# Patient Record
Sex: Male | Born: 1995 | Race: White | Hispanic: No | Marital: Single | Smoking: Never smoker
Health system: Southern US, Community
[De-identification: ages and names within clinical notes are randomized; demographics above are authoritative.]

## PROBLEM LIST (undated history)

## (undated) DIAGNOSIS — F909 Attention-deficit hyperactivity disorder, unspecified type: Secondary | ICD-10-CM

## (undated) HISTORY — DX: Attention-deficit hyperactivity disorder, unspecified type: F90.9

---
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
## 2014-04-05 DIAGNOSIS — F900 Attention-deficit hyperactivity disorder, predominantly inattentive type: Secondary | ICD-10-CM | POA: Insufficient documentation

## 2014-10-15 ENCOUNTER — Ambulatory Visit (INDEPENDENT_AMBULATORY_CARE_PROVIDER_SITE_OTHER): Payer: BC Managed Care – PPO | Admitting: Sports Medicine

## 2014-10-15 VITALS — BP 132/63 | Ht 68.0 in | Wt 165.0 lb

## 2014-10-15 DIAGNOSIS — R269 Unspecified abnormalities of gait and mobility: Secondary | ICD-10-CM

## 2014-10-15 DIAGNOSIS — M79673 Pain in unspecified foot: Secondary | ICD-10-CM | POA: Diagnosis not present

## 2014-10-15 NOTE — Progress Notes (Signed)
   Subjective:    Patient ID: Justin Grimes, male    DOB: 1995/03/25, 19 y.o.   MRN: [id number]  HPI chief complaint: New orthotics  Patient comes in today for new custom orthotics. He has had orthotics constructed in the past. He has orthotics for both his regular tennis shoes as well as soccer cleats. Please see the office notes from 09/08/2012 regarding the orthotic that was constructed for his soccer cleat by Dr.Fields. That orthotic was comfortable until he bought a new pair of soccer cleats. Those cleats are a little tighter at the toe box and as a result he has found his custom cleat orthotics to be uncomfortable. He brought in his new cleats for [place] to evaluate. He would also like a new pair of orthotics for his tennis shoes. His old orthotics are several years old and beginning to breakdown. 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 He is here today with his father.  Interim medical history reviewed Medications reviewed Allergies reviewed     Review of Systems As above    Objective:   Physical Exam Well-developed, fit appearing. No acute distress. Awake alert and oriented 3. Vital signs reviewed  Marked pronation bilaterally. Lateral deviation of great toes. Transverse arch collapse. Runs without a limp.       Assessment & Plan:  Pronation  A new pair of custom orthotics were constructed for his tennis shoes. He found them to be very comfortable. For his new soccer cleats we simply added a medium sized scaphoid pad on top of the existing insert. This will help give him some arch support without crowding up the rest of the cleat. He did find this to be comfortable. Follow-up as needed.  Please note that 40 minutes was spent with the patient with greater than 50% of the time spent in face-to-face consultation discussing orthotic construction, instruction, and fitting.   Patient was fitted for a : standard, cushioned, semi-rigid orthotic. The orthotic was heated and afterward the  patient stood on the orthotic blank positioned on the orthotic stand. The patient was positioned in subtalar neutral position and 10 degrees of ankle dorsiflexion in a weight bearing stance. After completion of molding, a stable base was applied to the orthotic blank. The blank was ground to a stable position for weight bearing. Size: 11 Base: Blue EVA Posting: none Additional orthotic padding: none

## 2014-11-29 DIAGNOSIS — F1520 Other stimulant dependence, uncomplicated: Secondary | ICD-10-CM | POA: Insufficient documentation

## 2015-04-11 ENCOUNTER — Ambulatory Visit (INDEPENDENT_AMBULATORY_CARE_PROVIDER_SITE_OTHER): Payer: BC Managed Care – PPO | Admitting: Family Medicine

## 2015-04-11 ENCOUNTER — Encounter: Payer: Self-pay | Admitting: Family Medicine

## 2015-04-11 VITALS — BP 102/62 | HR 60 | Ht 67.0 in | Wt 159.0 lb

## 2015-04-11 DIAGNOSIS — M9904 Segmental and somatic dysfunction of sacral region: Secondary | ICD-10-CM | POA: Diagnosis not present

## 2015-04-11 DIAGNOSIS — M999 Biomechanical lesion, unspecified: Secondary | ICD-10-CM | POA: Insufficient documentation

## 2015-04-11 DIAGNOSIS — M9903 Segmental and somatic dysfunction of lumbar region: Secondary | ICD-10-CM

## 2015-04-11 DIAGNOSIS — M533 Sacrococcygeal disorders, not elsewhere classified: Secondary | ICD-10-CM

## 2015-04-11 DIAGNOSIS — M9902 Segmental and somatic dysfunction of thoracic region: Secondary | ICD-10-CM | POA: Diagnosis not present

## 2015-04-11 DIAGNOSIS — M24559 Contracture, unspecified hip: Secondary | ICD-10-CM | POA: Insufficient documentation

## 2015-04-11 DIAGNOSIS — M24551 Contracture, right hip: Secondary | ICD-10-CM

## 2015-04-11 NOTE — Assessment & Plan Note (Signed)
Decision today to treat with OMT was based on Physical Exam  After verbal consent patient was treated with HVLA, ME techniques in thoracic, lumbar and sacral areas  Patient tolerated the procedure well with improvement in symptoms  Patient given exercises, stretches and lifestyle modifications  See medications in patient instructions if given  Patient will follow up in 3 weeks      

## 2015-04-11 NOTE — Progress Notes (Signed)
Justin Grimes [address] [place], [place] [address] Phone: [phone] Subjective:     CC: Low back pain  BJY:NWGNFAOZHY Justin Grimes is a 20 y.o. male coming in with complaint of low back pain. Patient describes it as more of a dull, throbbing aching sensation that seems to be after activity. Does not stop him from any activity. Patient does play soccer regularly and does lift weights. Has been trying to do more exercises and stretching which has seemed to help somewhat. No radiation down the leg or any numbness or weakness. Denies any bowel or bladder incontinence. Did not have any significant back pain until the last multiple months. Rates the severity of 4 out of 10 overall.     Past Medical History  Diagnosis Date  . ADHD (attention deficit hyperactivity disorder)    No past surgical history on file. Social History   Social History  . Marital Status: Single    Spouse Name: N/A  . Number of Children: N/A  . Years of Education: N/A   Social History Main Topics  . Smoking status: Never Smoker   . Smokeless tobacco: None  . Alcohol Use: None  . Drug Use: None  . Sexual Activity: Not Asked   Other Topics Concern  . None   Social History Narrative   No Known Allergies Family History  Problem Relation Age of Onset  . Hypertension Maternal Grandmother   . Hyperlipidemia Maternal Grandmother   . Drug abuse Maternal Grandfather   . Asthma Paternal Grandmother   . Heart disease Paternal Grandmother   . Kidney disease Neg Hx   . Learning disabilities Neg Hx   . Mental illness Neg Hx   . Mental retardation Neg Hx   . Miscarriages / Stillbirths Neg Hx   . Stroke Neg Hx   . Vision loss Neg Hx   . Hearing loss Neg Hx   . Diabetes Neg Hx   . Depression Neg Hx   . COPD Neg Hx   . Cancer Neg Hx   . Birth defects Neg Hx   . Alcohol abuse Neg Hx   . Arthritis Neg Hx     Past medical history, social, surgical and family history all  reviewed in electronic medical record.  No pertanent information unless stated regarding to the chief complaint.   Review of Systems: No headache, visual changes, nausea, vomiting, diarrhea, constipation, dizziness, abdominal pain, skin rash, fevers, chills, night sweats, weight loss, swollen lymph nodes, body aches, joint swelling, muscle aches, chest pain, shortness of breath, mood changes.   Objective Blood pressure 102/62, pulse 60, height  (1.702 m), weight 159 lb (72.122 kg), SpO2 99 %.  General: No apparent distress alert and oriented x3 mood and affect normal, dressed appropriately.  HEENT: Pupils equal, extraocular movements intact  Respiratory: Patient's speak in full sentences and does not appear short of breath  Cardiovascular: No lower extremity edema, non tender, no erythema  Skin: Warm dry intact with no signs of infection or rash on extremities or on axial skeleton.  Abdomen: Soft nontender  Neuro: Cranial nerves II through XII are intact, neurovascularly intact in all extremities with 2+ DTRs and 2+ pulses.  Lymph: No lymphadenopathy of posterior or anterior cervical chain or axillae bilaterally.  Gait normal with good balance and coordination.  MSK:  Non tender with full range of motion and good stability and symmetric strength and tone of shoulders, elbows, wrist, hip, knee and ankles  bilaterally.  Back Exam:  Inspection: Unremarkable  Motion: Flexion 45 deg, Extension 15 deg, Side Bending to 45 deg bilaterally, significantly tight hip flexors bilaterally record of left Rotation to 45 deg bilaterally  SLR laying: Negative  XSLR laying: Negative  Palpable tenderness: Mild discomfort of the right sacroiliac joint FABER: negative. Sensory change: Gross sensation intact to all lumbar and sacral dermatomes.  Reflexes: 2+ at both patellar tendons, 2+ at achilles tendons, Babinski's downgoing.  Strength at foot  Plantar-flexion: 5/5 Dorsi-flexion: 5/5 Eversion: 5/5  Inversion: 5/5  Leg strength  Quad: 5/5 Hamstring: 5/5 Hip flexor: 5/5 Hip abductors: 5/5  Gait unremarkable.  Osteopathic findings T3 extended rotated and side bent right L2 flexed rotated and side bent left Sacrum right on right  Procedure note 97110; 15 minutes spent for Therapeutic exercises as stated in above notes.  This included exercises focusing on stretching, strengthening, with significant focus on eccentric aspects.  Sacroiliac Joint Mobilization and Rehab 1. Work on pretzel stretching, shoulder back and leg draped in front. 3-5 sets, 30 sec.. 2. hip abductor rotations. standing, hip flexion and rotation outward then inward. 3 sets, 15 reps. when can do comfortably, add ankle weights starting at 2 pounds.  3. cross over stretching - shoulder back to ground, same side leg crossover. 3-5 sets for 30 min..  4. rolling up and back knees to chest and rocking. 5. sacral tilt - 5 sets, hold for 5-10 seconds  Proper technique shown and discussed handout in great detail with ATC.  All questions were discussed and answered.     Impression and Recommendations:     This case required medical decision making of moderate complexity.      Note: This dictation was prepared with Dragon dictation along with smaller phrase technology. Any transcriptional errors that result from this process are unintentional.        ;

## 2015-04-11 NOTE — Patient Instructions (Signed)
Good to see you.  Ice 20 minutes 2 times daily. Usually after activity and before bed. Exercises daily Vitamin D 2000 IU daily Choline  daily  OK to stay active! See me again in 3 weeks.

## 2015-04-11 NOTE — Progress Notes (Signed)
Pre visit review using our clinic review tool, if applicable. No additional management support is needed unless otherwise documented below in the visit note. 

## 2015-04-11 NOTE — Assessment & Plan Note (Signed)
Patient does have more of a sacroiliac joint dysfunction. Type hip flexors. Seems to be mostly on the right side. Responded well to osteopathic manipulation. Given core stabilization techniques and hip abductors. Discussed range of motion exercises. Patient was feeling significantly better after the manipulation. Discussed over-the-counter medications. She will work with Event organiser today. Patient will come back and see me again in 3 weeks for further evaluation and treatment.

## 2015-05-02 ENCOUNTER — Ambulatory Visit: Payer: BC Managed Care – PPO | Admitting: Family Medicine

## 2015-05-09 ENCOUNTER — Ambulatory Visit (INDEPENDENT_AMBULATORY_CARE_PROVIDER_SITE_OTHER): Payer: BC Managed Care – PPO | Admitting: Family Medicine

## 2015-05-09 ENCOUNTER — Encounter: Payer: Self-pay | Admitting: Family Medicine

## 2015-05-09 VITALS — BP 128/74 | HR 85 | Ht 67.0 in | Wt 155.0 lb

## 2015-05-09 DIAGNOSIS — M9904 Segmental and somatic dysfunction of sacral region: Secondary | ICD-10-CM | POA: Diagnosis not present

## 2015-05-09 DIAGNOSIS — M999 Biomechanical lesion, unspecified: Secondary | ICD-10-CM

## 2015-05-09 DIAGNOSIS — M9902 Segmental and somatic dysfunction of thoracic region: Secondary | ICD-10-CM | POA: Diagnosis not present

## 2015-05-09 DIAGNOSIS — M9903 Segmental and somatic dysfunction of lumbar region: Secondary | ICD-10-CM

## 2015-05-09 DIAGNOSIS — M533 Sacrococcygeal disorders, not elsewhere classified: Secondary | ICD-10-CM

## 2015-05-09 NOTE — Assessment & Plan Note (Signed)
Continues to have some mild difficulty with this. I do not think he'll be a long-term problem. Encourage him to continue work on core strength as well as hip abductor strengthening. We discussed again about diet and over-the-counter medications. We discussed continuing icing as necessary. Patient will come back in 4-6 weeks.

## 2015-05-09 NOTE — Progress Notes (Signed)
Pre visit review using our clinic review tool, if applicable. No additional management support is needed unless otherwise documented below in the visit note. 

## 2015-05-09 NOTE — Progress Notes (Signed)
Tawana ScaleZach Smith D.O. [facility] [address] [place], [place] [address] Phone: [phone](336) [phone] Subjective:     CC: Low back pain f/u  ONG:EXBMWUXLKGHPI:Subjective Justin LivingRobert Bobby Grimes is a 20 y.o. male coming in with complaint of low back pain. Patient had has significant tight hip flexors. Patient did have some muscle imbalances. Has been doing exercises. States that he did respond very well to the home exercises and was not having any pain for 2 weeks and then it started to slowly progressing again. Patient has been doing some the icing and doing the vitamins. Has noticed some mild improvement overall.     Past Medical History  Diagnosis Date  . ADHD (attention deficit hyperactivity disorder)    No past surgical history on file. Social History   Social History  . Marital Status: Single    Spouse Name: N/A  . Number of Children: N/A  . Years of Education: N/A   Social History Main Topics  . Smoking status: Never Smoker   . Smokeless tobacco: Not on file  . Alcohol Use: Not on file  . Drug Use: Not on file  . Sexual Activity: Not on file   Other Topics Concern  . Not on file   Social History Narrative   No Known Allergies Family History  Problem Relation Age of Onset  . Hypertension Maternal Grandmother   . Hyperlipidemia Maternal Grandmother   . Drug abuse Maternal Grandfather   . Asthma Paternal Grandmother   . Heart disease Paternal Grandmother   . Kidney disease Neg Hx   . Learning disabilities Neg Hx   . Mental illness Neg Hx   . Mental retardation Neg Hx   . Miscarriages / Stillbirths Neg Hx   . Stroke Neg Hx   . Vision loss Neg Hx   . Hearing loss Neg Hx   . Diabetes Neg Hx   . Depression Neg Hx   . COPD Neg Hx   . Cancer Neg Hx   . Birth defects Neg Hx   . Alcohol abuse Neg Hx   . Arthritis Neg Hx     Past medical history, social, surgical and family history all reviewed in electronic medical record.  No pertanent information unless stated regarding to the  chief complaint.   Review of Systems: No headache, visual changes, nausea, vomiting, diarrhea, constipation, dizziness, abdominal pain, skin rash, fevers, chills, night sweats, weight loss, swollen lymph nodes, body aches, joint swelling, muscle aches, chest pain, shortness of breath, mood changes.   Objective Blood pressure 128/74, pulse 85, weight 155 lb (70.308 kg), SpO2 98 %.  General: No apparent distress alert and oriented x3 mood and affect normal, dressed appropriately.  HEENT: Pupils equal, extraocular movements intact  Respiratory: Patient's speak in full sentences and does not appear short of breath  Cardiovascular: No lower extremity edema, non tender, no erythema  Skin: Warm dry intact with no signs of infection or rash on extremities or on axial skeleton.  Abdomen: Soft nontender  Neuro: Cranial nerves II through XII are intact, neurovascularly intact in all extremities with 2+ DTRs and 2+ pulses.  Lymph: No lymphadenopathy of posterior or anterior cervical chain or axillae bilaterally.  Gait normal with good balance and coordination.  MSK:  Non tender with full range of motion and good stability and symmetric strength and tone of shoulders, elbows, wrist, hip, knee and ankles bilaterally.  Back Exam:  Inspection: Unremarkable  Motion: Flexion 45 deg, Extension 15 deg, Side Bending to  45 deg bilaterally, continued tight hip flexors as well as tensor fascia lata tear on the right side Rotation to 45 deg bilaterally  SLR laying: Negative  XSLR laying: Negative  Palpable tenderness: Some mild pain over the sacroiliac joint bilaterally FABER: negative. Sensory change: Gross sensation intact to all lumbar and sacral dermatomes.  Reflexes: 2+ at both patellar tendons, 2+ at achilles tendons, Babinski's downgoing.  Strength at foot  Plantar-flexion: 5/5 Dorsi-flexion: 5/5 Eversion: 5/5 Inversion: 5/5  Leg strength  Quad: 5/5 Hamstring: 5/5 Hip flexor: 5/5 Hip abductors: 5/5  Gait  unremarkable.  Osteopathic findings T3 extended rotated and side bent right L2 flexed rotated and side bent left Sacrum right on right     Impression and Recommendations:     This case required medical decision making of moderate complexity.      Note: This dictation was prepared with Dragon dictation along with smaller phrase technology. Any transcriptional errors that result from this process are unintentional.        ;

## 2015-05-09 NOTE — Patient Instructions (Signed)
You are making progress Stretches only 3-5 times a week.  Ice is your friend Continue the vitamins See me again in 4-6 weeks.

## 2015-05-09 NOTE — Assessment & Plan Note (Signed)
Decision today to treat with OMT was based on Physical Exam  After verbal consent patient was treated with HVLA, ME techniques in thoracic, lumbar and sacral areas  Patient tolerated the procedure well with improvement in symptoms  Patient given exercises, stretches and lifestyle modifications  See medications in patient instructions if given  Patient will follow up in 4-6 weeks          

## 2015-06-13 ENCOUNTER — Ambulatory Visit (INDEPENDENT_AMBULATORY_CARE_PROVIDER_SITE_OTHER): Payer: BC Managed Care – PPO | Admitting: Family Medicine

## 2015-06-13 ENCOUNTER — Encounter: Payer: Self-pay | Admitting: Family Medicine

## 2015-06-13 VITALS — BP 120/76 | HR 66 | Wt 155.0 lb

## 2015-06-13 DIAGNOSIS — M9903 Segmental and somatic dysfunction of lumbar region: Secondary | ICD-10-CM

## 2015-06-13 DIAGNOSIS — M24551 Contracture, right hip: Secondary | ICD-10-CM | POA: Diagnosis not present

## 2015-06-13 DIAGNOSIS — M533 Sacrococcygeal disorders, not elsewhere classified: Secondary | ICD-10-CM

## 2015-06-13 DIAGNOSIS — M9902 Segmental and somatic dysfunction of thoracic region: Secondary | ICD-10-CM

## 2015-06-13 DIAGNOSIS — M9904 Segmental and somatic dysfunction of sacral region: Secondary | ICD-10-CM | POA: Diagnosis not present

## 2015-06-13 DIAGNOSIS — M999 Biomechanical lesion, unspecified: Secondary | ICD-10-CM

## 2015-06-13 NOTE — Progress Notes (Signed)
[name] [address] [place], [place] [address] Phone: [phone] Subjective:     CC: Low back pain f/u  BJY:NWGNFAOZHY Justin Grimes is a 20 y.o. male coming in with complaint of low back pain. Patient had has significant tight hip flexors. Patient did have some muscle imbalances. Has been doing exercises. Patient did have a soccer tournament this weekend when she was able to play never gave her no trouble. Patient also played tennis this weekend with no problems except some mild tightness of the hip flexor. States has not needed any anti-inflammatories. Overall feeling good. Even lift weights today.     Past Medical History  Diagnosis Date  . ADHD (attention deficit hyperactivity disorder)    No past surgical history on file. Social History   Social History  . Marital Status: Single    Spouse Name: N/A  . Number of Children: N/A  . Years of Education: N/A   Social History Main Topics  . Smoking status: Never Smoker   . Smokeless tobacco: Not on file  . Alcohol Use: Not on file  . Drug Use: Not on file  . Sexual Activity: Not on file   Other Topics Concern  . Not on file   Social History Narrative   No Known Allergies Family History  Problem Relation Age of Onset  . Hypertension Maternal Grandmother   . Hyperlipidemia Maternal Grandmother   . Drug abuse Maternal Grandfather   . Asthma Paternal Grandmother   . Heart disease Paternal Grandmother   . Kidney disease Neg Hx   . Learning disabilities Neg Hx   . Mental illness Neg Hx   . Mental retardation Neg Hx   . Miscarriages / Stillbirths Neg Hx   . Stroke Neg Hx   . Vision loss Neg Hx   . Hearing loss Neg Hx   . Diabetes Neg Hx   . Depression Neg Hx   . COPD Neg Hx   . Cancer Neg Hx   . Birth defects Neg Hx   . Alcohol abuse Neg Hx   . Arthritis Neg Hx     Past medical history, social, surgical and family history all reviewed in electronic medical record.  No  pertanent information unless stated regarding to the chief complaint.   Review of Systems: No headache, visual changes, nausea, vomiting, diarrhea, constipation, dizziness, abdominal pain, skin rash, fevers, chills, night sweats, weight loss, swollen lymph nodes, body aches, joint swelling, muscle aches, chest pain, shortness of breath, mood changes.   Objective There were no vitals taken for this visit.  General: No apparent distress alert and oriented x3 mood and affect normal, dressed appropriately.  HEENT: Pupils equal, extraocular movements intact  Respiratory: Patient's speak in full sentences and does not appear short of breath  Cardiovascular: No lower extremity edema, non tender, no erythema  Skin: Warm dry intact with no signs of infection or rash on extremities or on axial skeleton.  Abdomen: Soft nontender  Neuro: Cranial nerves II through XII are intact, neurovascularly intact in all extremities with 2+ DTRs and 2+ pulses.  Lymph: No lymphadenopathy of posterior or anterior cervical chain or axillae bilaterally.  Gait normal with good balance and coordination.  MSK:  Non tender with full range of motion and good stability and symmetric strength and tone of shoulders, elbows, wrist, hip, knee and ankles bilaterally.  Back Exam:  Inspection: Unremarkable  Motion: Flexion 45 deg, Extension 15 deg, Side Bending to 45  deg bilaterally,  Continued mild tightness of the hip flexors bilaterally Rotation to 45 deg bilaterally  SLR laying: Negative  XSLR laying: Negative  Palpable tenderness:  Very minimal discomfort over the sacroiliac joint right greater than left FABER: negative. Sensory change: Gross sensation intact to all lumbar and sacral dermatomes.  Reflexes: 2+ at both patellar tendons, 2+ at achilles tendons, Babinski's downgoing.  Strength at foot  Plantar-flexion: 5/5 Dorsi-flexion: 5/5 Eversion: 5/5 Inversion: 5/5  Leg strength  Quad: 5/5 Hamstring: 5/5 Hip flexor: 5/5  Hip abductors: 5/5  Gait unremarkable.  Osteopathic findings T3 extended rotated and side bent right L2 flexed rotated and side bent left L4 flexed rotated and side bent right Sacrum right on right     Impression and Recommendations:     This case required medical decision making of moderate complexity.      Note: This dictation was prepared with Dragon dictation along with smaller phrase technology. Any transcriptional errors that result from this process are unintentional.        ;

## 2015-06-13 NOTE — Assessment & Plan Note (Signed)
Continues to have tightness. Patient also has some discomfort over the quadratus lumborum. We discussed with patient to continue the same exercises and activity. We discussed icing regimen. Patient will come back and see me again in 6-8 weeks for further evaluation and treatment.

## 2015-06-13 NOTE — Assessment & Plan Note (Signed)
Decision today to treat with OMT was based on Physical Exam  After verbal consent patient was treated with HVLA, ME techniques in thoracic, lumbar and sacral areas  Patient tolerated the procedure well with improvement in symptoms  Patient given exercises, stretches and lifestyle modifications  See medications in patient instructions if given  Patient will follow up in 6-8 weeks    

## 2015-06-13 NOTE — Assessment & Plan Note (Signed)
Continues to have some mild rotation. We discussed the importance of the exercises regularly as well as the hip abductor strengthening. Follow-up in 6-8 weeks.

## 2015-06-13 NOTE — Patient Instructions (Signed)
Doing great  6-8 weeks see you then

## 2015-07-25 ENCOUNTER — Ambulatory Visit: Payer: BC Managed Care – PPO | Admitting: Family Medicine

## 2015-08-16 ENCOUNTER — Ambulatory Visit: Payer: BC Managed Care – PPO | Admitting: Family Medicine

## 2015-08-19 ENCOUNTER — Ambulatory Visit (INDEPENDENT_AMBULATORY_CARE_PROVIDER_SITE_OTHER): Payer: BC Managed Care – PPO | Admitting: Family Medicine

## 2015-08-19 ENCOUNTER — Encounter: Payer: Self-pay | Admitting: Family Medicine

## 2015-08-19 VITALS — BP 122/76 | HR 62 | Wt 151.0 lb

## 2015-08-19 DIAGNOSIS — M24551 Contracture, right hip: Secondary | ICD-10-CM

## 2015-08-19 DIAGNOSIS — M9903 Segmental and somatic dysfunction of lumbar region: Secondary | ICD-10-CM

## 2015-08-19 DIAGNOSIS — M9904 Segmental and somatic dysfunction of sacral region: Secondary | ICD-10-CM | POA: Diagnosis not present

## 2015-08-19 DIAGNOSIS — M9902 Segmental and somatic dysfunction of thoracic region: Secondary | ICD-10-CM

## 2015-08-19 DIAGNOSIS — M999 Biomechanical lesion, unspecified: Secondary | ICD-10-CM

## 2015-08-19 NOTE — Assessment & Plan Note (Signed)
Discussed with patient again that this is keep. He will continue to need to monitor this closely especially with him increasing her running. We discussed stretches again and showed proper technique. Patient continues respond well to osteopathic manipulation. We'll continue with the vitamin supplementation. We discussed icing regimen. Follow-up with me again in 8-10 weeks

## 2015-08-19 NOTE — Patient Instructions (Signed)
Good to see you  Ice is your friend when needed Stretch the hip flexors after running always Tennis ball between shoulder blades when sitting  Along amount of time On wall with heels, butt shoulder and head touching for a goal of 5 minutes daily  Keep wearing good shoes at work  See me again in 8ish weeks!

## 2015-08-19 NOTE — Assessment & Plan Note (Signed)
Decision today to treat with OMT was based on Physical Exam  After verbal consent patient was treated with HVLA, ME techniques in thoracic, lumbar and sacral areas  Patient tolerated the procedure well with improvement in symptoms  Patient given exercises, stretches and lifestyle modifications  See medications in patient instructions if given  Patient will follow up in 8-10 weeks      

## 2015-08-19 NOTE — Progress Notes (Signed)
Tawana ScaleZach Bryanne Riquelme D.O. [facility] [address] [place], [place] [address] Phone: [phone](336) [phone] Subjective:     CC: Low back pain f/u  BJY:NWGNFAOZHYHPI:Subjective Justin LivingRobert Bobby Grimes is a 20 y.o. male coming in with complaint of low back pain. Patient had has significant tight hip flexors. Patient did have some muscle imbalances. Has been doing exercises.  Patient has been running more regularly. Patient states that overall he seems to be doing well with some mild increase in stiffness of the lower back. No radiation the pain. States that the upper back seems to be doing better with him not studying as frequently. Patient feels that he is making some progress. Denies any radiation down the arm. States that if he does the stretching he seems to do relatively better. This not doing them on a regular basis.    Past Medical History  Diagnosis Date  . ADHD (attention deficit hyperactivity disorder)    No past surgical history on file. Social History   Social History  . Marital Status: Single    Spouse Name: N/A  . Number of Children: N/A  . Years of Education: N/A   Social History Main Topics  . Smoking status: Never Smoker   . Smokeless tobacco: Not on file  . Alcohol Use: Not on file  . Drug Use: Not on file  . Sexual Activity: Not on file   Other Topics Concern  . Not on file   Social History Narrative   No Known Allergies Family History  Problem Relation Age of Onset  . Hypertension Maternal Grandmother   . Hyperlipidemia Maternal Grandmother   . Drug abuse Maternal Grandfather   . Asthma Paternal Grandmother   . Heart disease Paternal Grandmother   . Kidney disease Neg Hx   . Learning disabilities Neg Hx   . Mental illness Neg Hx   . Mental retardation Neg Hx   . Miscarriages / Stillbirths Neg Hx   . Stroke Neg Hx   . Vision loss Neg Hx   . Hearing loss Neg Hx   . Diabetes Neg Hx   . Depression Neg Hx   . COPD Neg Hx   . Cancer Neg Hx   . Birth defects Neg Hx   .  Alcohol abuse Neg Hx   . Arthritis Neg Hx     Past medical history, social, surgical and family history all reviewed in electronic medical record.  No pertanent information unless stated regarding to the chief complaint.   Review of Systems: No headache, visual changes, nausea, vomiting, diarrhea, constipation, dizziness, abdominal pain, skin rash, fevers, chills, night sweats, weight loss, swollen lymph nodes, body aches, joint swelling, muscle aches, chest pain, shortness of breath, mood changes.   Objective There were no vitals taken for this visit.  General: No apparent distress alert and oriented x3 mood and affect normal, dressed appropriately.  HEENT: Pupils equal, extraocular movements intact  Respiratory: Patient's speak in full sentences and does not appear short of breath  Cardiovascular: No lower extremity edema, non tender, no erythema  Skin: Warm dry intact with no signs of infection or rash on extremities or on axial skeleton.  Abdomen: Soft nontender  Neuro: Cranial nerves II through XII are intact, neurovascularly intact in all extremities with 2+ DTRs and 2+ pulses.  Lymph: No lymphadenopathy of posterior or anterior cervical chain or axillae bilaterally.  Gait normal with good balance and coordination.  MSK:  Non tender with full range of motion and good stability  and symmetric strength and tone of shoulders, elbows, wrist, hip, knee and ankles bilaterally.  Back Exam:  Inspection: Unremarkable  Motion: Flexion 45 deg, Extension 25 deg, Side Bending to 45 deg bilaterally,  Continued mild tightness of the hip flexors bilaterally worse on the right side today Rotation to 45 deg bilaterally  SLR laying: Negative  XSLR laying: Negative  Palpable tenderness:  Increased tenderness over the right sacroiliac joint FABER: Moderately positive right. Sensory change: Gross sensation intact to all lumbar and sacral dermatomes.  Reflexes: 2+ at both patellar tendons, 2+ at achilles  tendons, Babinski's downgoing.  Strength at foot  Plantar-flexion: 5/5 Dorsi-flexion: 5/5 Eversion: 5/5 Inversion: 5/5  Leg strength  Quad: 5/5 Hamstring: 5/5 Hip flexor: 5/5 Hip abductors: 5/5  Gait unremarkable.  Osteopathic findings T2 extended rotated and side bent right L2 flexed rotated and side bent left L5 flexed rotated and side bent right Sacrum right on right (backward torsion)     Impression and Recommendations:     This case required medical decision making of moderate complexity.      Note: This dictation was prepared with Dragon dictation along with smaller phrase technology. Any transcriptional errors that result from this process are unintentional.        ;

## 2015-10-13 NOTE — Assessment & Plan Note (Deleted)
Overall doing well Discussed icing, HEP and core stabilization Discussed what to avoid as well.  No numbness.  RTC in 3-4 months

## 2015-10-13 NOTE — Progress Notes (Deleted)
Justin ScaleZach Grimes D.O. [facility] [address] [place], [place] [address] Phone: [phone] Subjective:     CC: Low back pain f/u  MVH:[id number]:Subjective  Justin Grimes is a 20 y.o. male coming in with complaint of low back pain. Patient had has significant tight hip flexors. Patient did have some muscle imbalances. Has been doing exercises.  Patient has been running more regularly. Patient states that overall he seems to be doing well with some mild increase in stiffness of the lower back. No radiation the pain. States that the upper back seems to be doing better with him not studying as frequently. Patient feels that he is making some progress. Denies any radiation down the arm. States that if he does the stretching he seems to do relatively better. This not doing them on a regular basis.    Past Medical History:  Diagnosis Date  . ADHD (attention deficit hyperactivity disorder)    No past surgical history on file. Social History   Social History  . Marital status: Single    Spouse name: N/A  . Number of children: N/A  . Years of education: N/A   Social History Main Topics  . Smoking status: Never Smoker  . Smokeless tobacco: Not on file  . Alcohol use Not on file  . Drug use: Unknown  . Sexual activity: Not on file   Other Topics Concern  . Not on file   Social History Narrative  . No narrative on file   No Known Allergies Family History  Problem Relation Age of Onset  . Hypertension Maternal Grandmother   . Hyperlipidemia Maternal Grandmother   . Drug abuse Maternal Grandfather   . Asthma Paternal Grandmother   . Heart disease Paternal Grandmother   . Kidney disease Neg Hx   . Learning disabilities Neg Hx   . Mental illness Neg Hx   . Mental retardation Neg Hx   . Miscarriages / Stillbirths Neg Hx   . Stroke Neg Hx   . Vision loss Neg Hx   . Hearing loss Neg Hx   . Diabetes Neg Hx   . Depression Neg Hx   . COPD Neg Hx   . Cancer Neg Hx   . Birth  defects Neg Hx   . Alcohol abuse Neg Hx   . Arthritis Neg Hx     Past medical history, social, surgical and family history all reviewed in electronic medical record.  No pertanent information unless stated regarding to the chief complaint.   Review of Systems: No headache, visual changes, nausea, vomiting, diarrhea, constipation, dizziness, abdominal pain, skin rash, fevers, chills, night sweats, weight loss, swollen lymph nodes, body aches, joint swelling, muscle aches, chest pain, shortness of breath, mood changes.   Objective  There were no vitals taken for this visit.  General: No apparent distress alert and oriented x3 mood and affect normal, dressed appropriately.  HEENT: Pupils equal, extraocular movements intact  Respiratory: Patient's speak in full sentences and does not appear short of breath  Cardiovascular: No lower extremity edema, non tender, no erythema  Skin: Warm dry intact with no signs of infection or rash on extremities or on axial skeleton.  Abdomen: Soft nontender  Neuro: Cranial nerves II through XII are intact, neurovascularly intact in all extremities with 2+ DTRs and 2+ pulses.  Lymph: No lymphadenopathy of posterior or anterior cervical chain or axillae bilaterally.  Gait normal with good balance and coordination.  MSK:  Non tender with full range  of motion and good stability and symmetric strength and tone of shoulders, elbows, wrist, hip, knee and ankles bilaterally.  Back Exam:  Inspection: Unremarkable  Motion: Flexion 45 deg, Extension 25 deg, Side Bending to 45 deg bilaterally,  Continued mild tightness of the hip flexors bilaterally worse on the right side today Rotation to 45 deg bilaterally  SLR laying: Negative  XSLR laying: Negative  Palpable tenderness:  Increased tenderness over the right sacroiliac joint FABER: Moderately positive right. Sensory change: Gross sensation intact to all lumbar and sacral dermatomes.  Reflexes: 2+ at both patellar  tendons, 2+ at achilles tendons, Babinski's downgoing.  Strength at foot  Plantar-flexion: 5/5 Dorsi-flexion: 5/5 Eversion: 5/5 Inversion: 5/5  Leg strength  Quad: 5/5 Hamstring: 5/5 Hip flexor: 5/5 Hip abductors: 5/5  Gait unremarkable.  Osteopathic findings T2 extended rotated and side bent right L2 flexed rotated and side bent left L5 flexed rotated and side bent right Sacrum right on right (backward torsion)     Impression and Recommendations:     This case required medical decision making of moderate complexity.      Note: This dictation was prepared with Dragon dictation along with smaller phrase technology. Any transcriptional errors that result from this process are unintentional.        ;

## 2015-10-13 NOTE — Assessment & Plan Note (Deleted)
Decision today to treat with OMT was based on Physical Exam  After verbal consent patient was treated with HVLA, ME techniques in thoracic, lumbar and sacral areas  Patient tolerated the procedure well with improvement in symptoms  Patient given exercises, stretches and lifestyle modifications  See medications in patient instructions if given  Patient will follow up in 12-16 weeks

## 2015-10-14 ENCOUNTER — Telehealth: Payer: Self-pay | Admitting: Emergency Medicine

## 2015-10-14 ENCOUNTER — Ambulatory Visit: Payer: BC Managed Care – PPO | Admitting: Family Medicine

## 2015-10-14 NOTE — Telephone Encounter (Signed)
Pt missed his 8 wk follow up appointment today. Would you like me to call him to reschedule?

## 2016-03-16 ENCOUNTER — Ambulatory Visit (INDEPENDENT_AMBULATORY_CARE_PROVIDER_SITE_OTHER): Payer: BC Managed Care – PPO | Admitting: Family Medicine

## 2016-03-16 ENCOUNTER — Encounter: Payer: Self-pay | Admitting: Family Medicine

## 2016-03-16 VITALS — BP 134/82 | HR 82 | Ht 67.0 in | Wt 148.0 lb

## 2016-03-16 DIAGNOSIS — M533 Sacrococcygeal disorders, not elsewhere classified: Secondary | ICD-10-CM | POA: Diagnosis not present

## 2016-03-16 DIAGNOSIS — M999 Biomechanical lesion, unspecified: Secondary | ICD-10-CM

## 2016-03-16 NOTE — Patient Instructions (Signed)
Good to see you  Ice is your friend when needed See me again in 3 months Have a great time

## 2016-03-16 NOTE — Progress Notes (Signed)
[name] [address] [place], [place] [address] Phone: [phone] Subjective:    CC: Low back pain f/u  BJY:NWGNFAOZHY  Justin Grimes is a 21 y.o. male coming in with complaint of low back pain. Patient had has significant tight hip flexors. Patient did have some muscle imbalances. Has been doing exercises. Patient states overall he has been doing relatively well. Some mild tightness but nothing severe. Some mild worsening from previous exam he states. Patient will be leaving the country for 3[place]2 months and is looking for some other guided slightly is out of the country.   Past Medical History:  Diagnosis Date  . ADHD (attention deficit hyperactivity disorder)    No past surgical history on file. Social History   Social History  . Marital status: Single    Spouse name: N/A  . Number of children: N/A  . Years of education: N/A   Social History Main Topics  . Smoking status: Never Smoker  . Smokeless tobacco: None  . Alcohol use None  . Drug use: Unknown  . Sexual activity: Not Asked   Other Topics Concern  . None   Social History Narrative  . None   No Known Allergies Family History  Problem Relation Age of Onset  . Hypertension Maternal Grandmother   . Hyperlipidemia Maternal Grandmother   . Drug abuse Maternal Grandfather   . Asthma Paternal Grandmother   . Heart disease Paternal Grandmother   . Kidney disease Neg Hx   . Learning disabilities Neg Hx   . Mental illness Neg Hx   . Mental retardation Neg Hx   . Miscarriages / Stillbirths Neg Hx   . Stroke Neg Hx   . Vision loss Neg Hx   . Hearing loss Neg Hx   . Diabetes Neg Hx   . Depression Neg Hx   . COPD Neg Hx   . Cancer Neg Hx   . Birth defects Neg Hx   . Alcohol abuse Neg Hx   . Arthritis Neg Hx     Past medical history, social, surgical and family history all reviewed in electronic medical record.  No pertanent information unless stated regarding to the chief  complaint.  Review of Systems: No headache, visual changes, nausea, vomiting, diarrhea, constipation, dizziness, abdominal pain, skin rash, fevers, chills, night sweats, weight loss, swollen lymph nodes, body aches, joint swelling, muscle aches, chest pain, shortness of breath, mood changes.     Objective  Blood pressure 134/82, pulse 82, height 5\' 7"  (1.702 m), weight 148 lb (67.1 kg), SpO2 97 %.  Systems examined below as of 03/16/16 General: NAD A&O x3 mood, affect normal  HEENT: Pupils equal, extraocular movements intact no nystagmus Respiratory: not short of breath at rest or with speaking Cardiovascular: No lower extremity edema, non tender Skin: Warm dry intact with no signs of infection or rash on extremities or on axial skeleton. Abdomen: Soft nontender, no masses Neuro: Cranial nerves  intact, neurovascularly intact in all extremities with 2+ DTRs and 2+ pulses. Lymph: No lymphadenopathy appreciated today  Gait normal with good balance and coordination.  MSK: Non tender with full range of motion and good stability and symmetric strength and tone of shoulders, elbows, wrist,  knee hips and ankles bilaterally.   Back Exam:  Inspection: Unremarkable  Motion: Flexion 45 deg, Extension 25 deg, Side Bending to 45 deg bilaterally,   Minimal discomfort over the right sacroiliac joint Rotation to 45 deg bilaterally  SLR  laying: Negative  XSLR laying: Negative  Palpable tenderness:  Increased tenderness over the right sacroiliac joint FABER: Continue mild tightness on the rent sign. Sensory change: Gross sensation intact to all lumbar and sacral dermatomes.  Reflexes: 2+ at both patellar tendons, 2+ at achilles tendons, Babinski's downgoing.  Strength at foot  Plantar-flexion: 5/5 Dorsi-flexion: 5/5 Eversion: 5/5 Inversion: 5/5  Leg strength  Quad: 5/5 Hamstring: 5/5 Hip flexor: 5/5 Hip abductors: 5/5  Gait unremarkable.  Osteopathic findings Cervical C2 flexed rotated and side  bent right C4 flexed rotated and side bent left C6 flexed rotated and side bent left T3 extended rotated and side bent right inhaled third rib T9 extended rotated and side bent left L2 flexed rotated and side bent right Sacrum right on right      Impression and Recommendations:     This case required medical decision making of moderate complexity.      Note: This dictation was prepared with Dragon dictation along with smaller phrase technology. Any transcriptional errors that result from this process are unintentional.        ;

## 2016-03-16 NOTE — Assessment & Plan Note (Signed)
Doing relatively well overall. We discussed icing regimen and home exercises. We discussed objective is doing which was to avoid. Continues to active. Patient will follow-up in 3 months.

## 2016-03-16 NOTE — Assessment & Plan Note (Signed)
Decision today to treat with OMT was based on Physical Exam  After verbal consent patient was treated with HVLA, ME techniques in thoracic, lumbar and sacral areas  Patient tolerated the procedure well with improvement in symptoms  Patient given exercises, stretches and lifestyle modifications  See medications in patient instructions if given  Patient will follow up in 12 weeks                                            

## 2016-09-29 ENCOUNTER — Ambulatory Visit (INDEPENDENT_AMBULATORY_CARE_PROVIDER_SITE_OTHER): Payer: BC Managed Care – PPO | Admitting: Family Medicine

## 2016-09-29 ENCOUNTER — Ambulatory Visit: Payer: Self-pay

## 2016-09-29 ENCOUNTER — Encounter: Payer: Self-pay | Admitting: Family Medicine

## 2016-09-29 VITALS — BP 120/70 | HR 78 | Ht 67.0 in | Wt 151.0 lb

## 2016-09-29 DIAGNOSIS — M999 Biomechanical lesion, unspecified: Secondary | ICD-10-CM | POA: Diagnosis not present

## 2016-09-29 DIAGNOSIS — M25561 Pain in right knee: Secondary | ICD-10-CM

## 2016-09-29 DIAGNOSIS — S86891A Other injury of other muscle(s) and tendon(s) at lower leg level, right leg, initial encounter: Secondary | ICD-10-CM | POA: Insufficient documentation

## 2016-09-29 NOTE — Progress Notes (Signed)
Tawana ScaleZach Smith D.O. [facility] [address] [place], [place] [address] Phone: [phone] Subjective:    I'm seeing this patient by the request  of:    CC: Right knee pain, follow-up back pain  BJY:NWGNFAOZHYHPI:Subjective  Justin LivingRobert Bobby Sartin is a 21 y.o. male coming in with complaint of right knee pain. Patient was planed soccer and did have an individual run into the knee. This is approximately 1 month ago. Continues to have discomfort with standing. No pain. Does not stop him from any activities. Seems to be on the anterior aspect of the knee. Associated with swelling. No radiation of pain and no giving out on him.  Patient is also having some back pain. Has seen patient multiple times for this. Has responded well to osteopathic manipulation. Some increasing tightness. Nothing new. Continuing to study at school.     Past Medical History:  Diagnosis Date  . ADHD (attention deficit hyperactivity disorder)    No past surgical history on file. Social History   Social History  . Marital status: Single    Spouse name: N/A  . Number of children: N/A  . Years of education: N/A   Social History Main Topics  . Smoking status: Never Smoker  . Smokeless tobacco: Never Used  . Alcohol use None  . Drug use: Unknown  . Sexual activity: Not Asked   Other Topics Concern  . None   Social History Narrative  . None   No Known Allergies Family History  Problem Relation Age of Onset  . Hypertension Maternal Grandmother   . Hyperlipidemia Maternal Grandmother   . Drug abuse Maternal Grandfather   . Asthma Paternal Grandmother   . Heart disease Paternal Grandmother   . Kidney disease Neg Hx   . Learning disabilities Neg Hx   . Mental illness Neg Hx   . Mental retardation Neg Hx   . Miscarriages / Stillbirths Neg Hx   . Stroke Neg Hx   . Vision loss Neg Hx   . Hearing loss Neg Hx   . Diabetes Neg Hx   . Depression Neg Hx   . COPD Neg Hx   . Cancer Neg Hx   . Birth defects Neg Hx    . Alcohol abuse Neg Hx   . Arthritis Neg Hx      Past medical history, social, surgical and family history all reviewed in electronic medical record.  No pertanent information unless stated regarding to the chief complaint.   Review of Systems: No headache, visual changes, nausea, vomiting, diarrhea, constipation, dizziness, abdominal pain, skin rash, fevers, chills, night sweats, weight loss, swollen lymph nodes, body aches, joint swelling, chest pain, shortness of breath, mood changes.  Positive muscle aches  Objective  Blood pressure 120/70, height 5\' 7"  (1.702 m). Systems examined below as of 09/29/16   General: No apparent distress alert and oriented x3 mood and affect normal, dressed appropriately.  HEENT: Pupils equal, extraocular movements intact  Respiratory: Patient's speak in full sentences and does not appear short of breath  Cardiovascular: No lower extremity edema, non tender, no erythema  Skin: Warm dry intact with no signs of infection or rash on extremities or on axial skeleton.  Abdomen: Soft nontender  Neuro: Cranial nerves II through XII are intact, neurovascularly intact in all extremities with 2+ DTRs and 2+ pulses.  Lymph: No lymphadenopathy of posterior or anterior cervical chain or axillae bilaterally.  Gait normal with good balance and coordination.  MSK:  Non tender  with full range of motion and good stability and symmetric strength and tone of shoulders, elbows, wrist, hip, and ankles bilaterally.  Knee: Right Normal to inspection with no erythema or effusion or obvious bony abnormalities. Tender to palpation mostly over the patella inferior ROM full in flexion and extension and lower leg rotation. Ligaments with solid consistent endpoints including ACL, PCL, LCL, MCL. Negative Mcmurray's, Apley's, and Thessalonian tests. Non painful patellar compression. Patellar glide with mild crepitus. Patellar and quadriceps tendons unremarkable. Hamstring and  quadriceps strength is normal.  Contralateral knee unremarkable  MSK US performed of: right knee This study was ordered, performed, and interpreted by Terrilee FilesZach Smith D.O.  Knee: Patient's anterior knee shows the patient does have what appears to be horizontal fracture with very mild healing over the patella itself. 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 Otherwise fairly unremarkable.   IMPRESSION:  Small avulsion of patella  Osteopathic findings C2 flexed rotated and side bent right C4 flexed rotated and side bent left T3 extended rotated and side bent right inhaled third rib T9 extended rotated and side bent left L2 flexed rotated and side bent right Sacrum right on right       Impression and Recommendations:     This case required medical decision making of moderate complexity.      Note: This dictation was prepared with Dragon dictation along with smaller phrase technology. Any transcriptional errors that result from this process are unintentional.

## 2016-09-29 NOTE — Assessment & Plan Note (Signed)
Small topical anti-inflammatories and icing. Avoid high impact exercises for short course. Follow-up again in 4 weeks

## 2016-09-29 NOTE — Assessment & Plan Note (Signed)
Decision today to treat with OMT was based on Physical Exam  After verbal consent patient was treated with HVLA, ME, FPR techniques in cervical, thoracic, lumbar and sacral areas  Patient tolerated the procedure well with improvement in symptoms  Patient given exercises, stretches and lifestyle modifications  See medications in patient instructions if given  Patient will follow up in 4 weeks 

## 2016-09-29 NOTE — Patient Instructions (Signed)
Good to see you  [name] is your friend. Ice 20 minutes 2 times daily. Usually after activity and before bed. Patella strap daily for 2 weeks.  Increase vitamin D to 4000 IU daily for 2 weeks.  No running or jumping for 2 weeks.  OK to bike or elliptical for now  See me again in 4 weeks or send me a message if you got questions Good luck!

## 2016-10-30 ENCOUNTER — Ambulatory Visit: Payer: BC Managed Care – PPO | Admitting: Family Medicine

## 2016-11-09 ENCOUNTER — Ambulatory Visit (INDEPENDENT_AMBULATORY_CARE_PROVIDER_SITE_OTHER): Payer: BC Managed Care – PPO | Admitting: Family Medicine

## 2016-11-09 ENCOUNTER — Encounter: Payer: Self-pay | Admitting: Family Medicine

## 2016-11-09 VITALS — BP 122/76 | HR 76 | Ht 67.0 in | Wt 154.0 lb

## 2016-11-09 DIAGNOSIS — M533 Sacrococcygeal disorders, not elsewhere classified: Secondary | ICD-10-CM | POA: Diagnosis not present

## 2016-11-09 DIAGNOSIS — M999 Biomechanical lesion, unspecified: Secondary | ICD-10-CM

## 2016-11-09 DIAGNOSIS — S86891A Other injury of other muscle(s) and tendon(s) at lower leg level, right leg, initial encounter: Secondary | ICD-10-CM

## 2016-11-09 NOTE — Assessment & Plan Note (Signed)
Stable overall. Patient does have some difficulty with muscle imbalances. Discussed possible changes and ergonomics at studying that could be beneficial as well. We discussed proper positioning his computer. Discussed icing regimen. Continue stay active otherwise. Follow-up with me again in 8 weeks.

## 2016-11-09 NOTE — Assessment & Plan Note (Signed)
Decision today to treat with OMT was based on Physical Exam  After verbal consent patient was treated with HVLA, ME, FPR techniques in cervical, thoracic, lumbar and sacral areas  Patient tolerated the procedure well with improvement in symptoms  Patient given exercises, stretches and lifestyle modifications  See medications in patient instructions if given  Patient will follow up in 8 weeks 

## 2016-11-09 NOTE — Progress Notes (Signed)
Tawana ScaleZach Grimes D.O.  [facility] [address] [place], [place] [address] Phone: [phone] Subjective:    I'm seeing this patient by the request  of:    CC: neck and back pain.   BJY:NWGNFAOZHYHPI:Subjective  Justin Grimes is a 21 y.o. male coming in with complaint of neck and back pain. Patient was found to have a significant amount tightness previously. Has started studying again. Has responded well to a supine manipulation previously. Patient denies any radiation down the legs. More tightness on the left sacroiliac joint than usual.  Patient was also found have a small avulsion of the patella on the right side. Patient was able to play soccer without any significant pain in the last week. Mild swelling that happens from time to time.      Past Medical History:  Diagnosis Date  . ADHD (attention deficit hyperactivity disorder)    No past surgical history on file. Social History   Social History  . Marital status: Single    Spouse name: N/A  . Number of children: N/A  . Years of education: N/A   Social History Main Topics  . Smoking status: Never Smoker  . Smokeless tobacco: Never Used  . Alcohol use None  . Drug use: Unknown  . Sexual activity: Not Asked   Other Topics Concern  . None   Social History Narrative  . None   No Known Allergies Family History  Problem Relation Age of Onset  . Hypertension Maternal Grandmother   . Hyperlipidemia Maternal Grandmother   . Drug abuse Maternal Grandfather   . Asthma Paternal Grandmother   . Heart disease Paternal Grandmother   . Kidney disease Neg Hx   . Learning disabilities Neg Hx   . Mental illness Neg Hx   . Mental retardation Neg Hx   . Miscarriages / Stillbirths Neg Hx   . Stroke Neg Hx   . Vision loss Neg Hx   . Hearing loss Neg Hx   . Diabetes Neg Hx   . Depression Neg Hx   . COPD Neg Hx   . Cancer Neg Hx   . Birth defects Neg Hx   . Alcohol abuse Neg Hx   . Arthritis Neg Hx      Past medical  history, social, surgical and family history all reviewed in electronic medical record.  No pertanent information unless stated regarding to the chief complaint.   Review of Systems:Review of systems updated and as accurate as of 11/09/16  No headache, visual changes, nausea, vomiting, diarrhea, constipation, dizziness, abdominal pain, skin rash, fevers, chills, night sweats, weight loss, swollen lymph nodes, body aches, joint swelling, muscle aches, chest pain, shortness of breath, mood changes.   Objective  Blood pressure 122/76, pulse 76, height 5\' 7"  (1.702 m), weight 154 lb (69.9 kg), SpO2 98 %. Systems examined below as of 11/09/16   General: No apparent distress alert and oriented x3 mood and affect normal, dressed appropriately.  HEENT: Pupils equal, extraocular movements intact  Respiratory: Patient's speak in full sentences and does not appear short of breath  Cardiovascular: No lower extremity edema, non tender, no erythema  Skin: Warm dry intact with no signs of infection or rash on extremities or on axial skeleton.  Abdomen: Soft nontender  Neuro: Cranial nerves II through XII are intact, neurovascularly intact in all extremities with 2+ DTRs and 2+ pulses.  Lymph: No lymphadenopathy of posterior or anterior cervical chain or axillae bilaterally.  Gait normal with good  balance and coordination.  MSK:  Non tender with full range of motion and good stability and symmetric strength and tone of shoulders, elbows, wrist, hip, and ankles bilaterally.  Knee: Right Normal to inspection with no erythema or effusion or obvious bony abnormalities. Palpation normal with no warmth, joint line tenderness, patellar tenderness, or condyle tenderness. ROM full in flexion and extension and lower leg rotation. Ligaments with solid consistent endpoints including ACL, PCL, LCL, MCL. Negative Mcmurray's, Apley's, and Thessalonian tests. Non painful patellar compression. Patellar glide without  crepitus. Patellar and quadriceps tendons unremarkable. Hamstring and quadriceps strength is normal.  Contralateral knee unremarkable  Neck: Inspection unremarkable. No palpable stepoffs. Negative Spurling's maneuver. Full neck range of motion Grip strength and sensation normal in bilateral hands Strength good C4 to T1 distribution No sensory change to C4 to T1 Negative Hoffman sign bilaterally Reflexes normal Tenderness of the trapezius bilaterally right greater than left  Osteopathic findings C2 flexed rotated and side bent right T3 extended rotated and side bent right inhaled third rib T9 extended rotated and side bent left L4 flexed rotated and side bent right Sacrum left on left     Impression and Recommendations:     This case required medical decision making of moderate complexity.      Note: This dictation was prepared with Dragon dictation along with smaller phrase technology. Any transcriptional errors that result from this process are unintentional.

## 2016-11-09 NOTE — Assessment & Plan Note (Signed)
No tenderness noted on exam today.

## 2016-11-09 NOTE — Patient Instructions (Signed)
Good to see you  Justin Grimes is your friend.   Stay active  See me again in 8 weeks if you need me.

## 2016-12-09 NOTE — Progress Notes (Signed)
[name] [address] [place], [place] [address] Phone: [phone] Subjective:     CC: Bilateral hip pain  [id number]  Justin Grimes is a 21 y.o. male coming in with complaint of hip pain on right side. He feels tightness in the anterior hip. He is more active during the week. Has been seen previously and has had significant tightness of the hip flexors. Has responded well to manipulation previously. Has increased the amount of soccer he is been playing recently.  Denies any other symptoms. States that it seems to be after activity when he has the discomfort. Seems to also be worse when sitting.    Past Medical History:  Diagnosis Date  . ADHD (attention deficit hyperactivity disorder)    No past surgical history on file. Social History   Social History  . Marital status: Single    Spouse name: N/A  . Number of children: N/A  . Years of education: N/A   Social History Main Topics  . Smoking status: Never Smoker  . Smokeless tobacco: Never Used  . Alcohol use None  . Drug use: Unknown  . Sexual activity: Not Asked   Other Topics Concern  . None   Social History Narrative  . None   No Known Allergies Family History  Problem Relation Age of Onset  . Hypertension Maternal Grandmother   . Hyperlipidemia Maternal Grandmother   . Drug abuse Maternal Grandfather   . Asthma Paternal Grandmother   . Heart disease Paternal Grandmother   . Kidney disease Neg Hx   . Learning disabilities Neg Hx   . Mental illness Neg Hx   . Mental retardation Neg Hx   . Miscarriages / Stillbirths Neg Hx   . Stroke Neg Hx   . Vision loss Neg Hx   . Hearing loss Neg Hx   . Diabetes Neg Hx   . Depression Neg Hx   . COPD Neg Hx   . Cancer Neg Hx   . Birth defects Neg Hx   . Alcohol abuse Neg Hx   . Arthritis Neg Hx      Past medical history, social, surgical and family history all reviewed in electronic medical record.  No pertanent  information unless stated regarding to the chief complaint.   Review of Systems:Review of systems updated and as accurate as of 12/10/16  No headache, visual changes, nausea, vomiting, diarrhea, constipation, dizziness, abdominal pain, skin rash, fevers, chills, night sweats, weight loss, swollen lymph nodes, body aches, joint swelling, muscle aches, chest pain, shortness of breath, mood changes.   Objective  Blood pressure 120/76, pulse (!) 115, height  (1.702 m), weight 150 lb (68 kg), SpO2 98 %. Systems examined below as of 12/10/16   General: No apparent distress alert and oriented x3 mood and affect normal, dressed appropriately.  HEENT: Pupils equal, extraocular movements intact  Respiratory: Patient's speak in full sentences and does not appear short of breath  Cardiovascular: No lower extremity edema, non tender, no erythema  Skin: Warm dry intact with no signs of infection or rash on extremities or on axial skeleton.  Abdomen: Soft nontender  Neuro: Cranial nerves II through XII are intact, neurovascularly intact in all extremities with 2+ DTRs and 2+ pulses.  Lymph: No lymphadenopathy of posterior or anterior cervical chain or axillae bilaterally.  Gait normal with good balance and coordination.  MSK:  Non tender with full range of motion and good stability and symmetric strength and  tone of shoulders, elbows, wrist,  knee and ankles bilaterally.  Hip exam bilaterally shows significant tightness with extension. Mild discomfort over the sacroiliac joints bilaterally. Mild decrease in rotation and side bending of the lower back. Negative straight leg test. Full strength of the lower extremities  Osteopathic findings C4 flexed rotated and side bent left C6 flexed rotated and side bent left T3 extended rotated and side bent right inhaled third rib T7 extended rotated and side bent left L2 flexed rotated and side bent right Sacrum right on right    Impression and  Recommendations:     This case required medical decision making of moderate complexity.      Note: This dictation was prepared with Dragon dictation along with smaller phrase technology. Any transcriptional errors that result from this process are unintentional.

## 2016-12-10 ENCOUNTER — Ambulatory Visit (INDEPENDENT_AMBULATORY_CARE_PROVIDER_SITE_OTHER): Payer: BC Managed Care – PPO | Admitting: Family Medicine

## 2016-12-10 ENCOUNTER — Encounter: Payer: Self-pay | Admitting: Family Medicine

## 2016-12-10 VITALS — BP 120/76 | HR 115 | Ht 67.0 in | Wt 150.0 lb

## 2016-12-10 DIAGNOSIS — M24551 Contracture, right hip: Secondary | ICD-10-CM | POA: Diagnosis not present

## 2016-12-10 DIAGNOSIS — M999 Biomechanical lesion, unspecified: Secondary | ICD-10-CM | POA: Diagnosis not present

## 2016-12-10 NOTE — Assessment & Plan Note (Signed)
Continues to have significant tightness. No pain over the pubic bone. I do not think fracture. Discuss if any worsening symptoms we'll consider further evaluation treatment. Patient will continue with the osteopathic manipulation. Follow-up with me again in 6 weeks.

## 2016-12-10 NOTE — Assessment & Plan Note (Signed)
Decision today to treat with OMT was based on Physical Exam  After verbal consent patient was treated with HVLA, ME, FPR techniques in cervical, thoracic, lumbar and sacral areas  Patient tolerated the procedure well with improvement in symptoms  Patient given exercises, stretches and lifestyle modifications  See medications in patient instructions if given  Patient will follow up in 6 weeks 

## 2016-12-10 NOTE — Patient Instructions (Signed)
Good to see you  Justin Grimes is your friend.  Keep up on the stretches.  I like the idea of standing when you can  You have to eat within 30 minutes of exercise. Carbs and protein  See me again in 6 weeks if you need me.
